# Patient Record
Sex: Female | Born: 1955 | Race: Black or African American | Hispanic: No | Marital: Single
Health system: Southern US, Community
[De-identification: ages and names within clinical notes are randomized; demographics above are authoritative.]

---

## 2011-03-13 ENCOUNTER — Emergency Department: Payer: Self-pay | Admitting: Emergency Medicine

## 2011-10-03 ENCOUNTER — Ambulatory Visit: Payer: Self-pay | Admitting: Bariatrics

## 2011-10-09 ENCOUNTER — Ambulatory Visit: Payer: Self-pay | Admitting: Bariatrics

## 2011-10-09 LAB — COMPREHENSIVE METABOLIC PANEL
Albumin: 3.6 g/dL (ref 3.4–5.0)
Alkaline Phosphatase: 87 U/L (ref 50–136)
Anion Gap: 10 (ref 7–16)
BUN: 13 mg/dL (ref 7–18)
Bilirubin,Total: 0.4 mg/dL (ref 0.2–1.0)
Co2: 28 mmol/L (ref 21–32)
Creatinine: 0.89 mg/dL (ref 0.60–1.30)
Osmolality: 282 (ref 275–301)
Potassium: 3.9 mmol/L (ref 3.5–5.1)
SGPT (ALT): 33 U/L
Sodium: 141 mmol/L (ref 136–145)

## 2011-10-09 LAB — CBC WITH DIFFERENTIAL/PLATELET
HCT: 36.4 % (ref 35.0–47.0)
Lymphocyte #: 1.3 10*3/uL (ref 1.0–3.6)
MCH: 29.9 pg (ref 26.0–34.0)
MCV: 91 fL (ref 80–100)
Monocyte #: 0.3 10*3/uL (ref 0.0–0.7)
Monocyte %: 9.5 %
Neutrophil %: 49.6 %
Platelet: 192 10*3/uL (ref 150–440)
RDW: 12.7 % (ref 11.5–14.5)

## 2011-10-09 LAB — TSH: Thyroid Stimulating Horm: 1.96 u[IU]/mL

## 2011-10-09 LAB — PHOSPHORUS: Phosphorus: 3 mg/dL (ref 2.5–4.9)

## 2011-10-09 LAB — MAGNESIUM: Magnesium: 1.6 mg/dL — ABNORMAL LOW

## 2011-10-09 LAB — LIPASE, BLOOD: Lipase: 78 U/L (ref 73–393)

## 2011-10-09 LAB — PROTIME-INR: INR: 1

## 2011-10-23 ENCOUNTER — Ambulatory Visit: Payer: Self-pay | Admitting: Bariatrics

## 2011-10-30 ENCOUNTER — Ambulatory Visit: Payer: Self-pay | Admitting: Bariatrics

## 2011-11-08 ENCOUNTER — Inpatient Hospital Stay: Payer: Self-pay | Admitting: Specialist

## 2011-11-08 LAB — BASIC METABOLIC PANEL
Anion Gap: 9 (ref 7–16)
BUN: 44 mg/dL — ABNORMAL HIGH (ref 7–18)
Calcium, Total: 8.9 mg/dL (ref 8.5–10.1)
Chloride: 99 mmol/L (ref 98–107)
Co2: 29 mmol/L (ref 21–32)
Creatinine: 2.03 mg/dL — ABNORMAL HIGH (ref 0.60–1.30)
EGFR (African American): 31 — ABNORMAL LOW
EGFR (Non-African Amer.): 27 — ABNORMAL LOW
Glucose: 135 mg/dL — ABNORMAL HIGH (ref 65–99)
Sodium: 137 mmol/L (ref 136–145)

## 2011-11-08 LAB — CBC
MCV: 91 fL (ref 80–100)
Platelet: 221 10*3/uL (ref 150–440)
RDW: 13.8 % (ref 11.5–14.5)

## 2011-11-09 LAB — BASIC METABOLIC PANEL
Co2: 28 mmol/L (ref 21–32)
Creatinine: 1.12 mg/dL (ref 0.60–1.30)
EGFR (African American): >60
Osmolality: 291 (ref 275–301)
Sodium: 137 mmol/L (ref 136–145)

## 2011-11-20 ENCOUNTER — Ambulatory Visit: Payer: Self-pay | Admitting: Bariatrics

## 2011-11-26 ENCOUNTER — Ambulatory Visit: Payer: Self-pay | Admitting: Bariatrics

## 2012-01-06 ENCOUNTER — Ambulatory Visit: Payer: Self-pay | Admitting: Bariatrics

## 2012-01-13 ENCOUNTER — Inpatient Hospital Stay: Payer: Self-pay | Admitting: Bariatrics

## 2012-01-14 LAB — CBC WITH DIFFERENTIAL/PLATELET
Basophil #: 0 10*3/uL (ref 0.0–0.1)
Eosinophil %: 0.4 %
HGB: 12.4 g/dL (ref 12.0–16.0)
Lymphocyte %: 14.5 %
MCH: 30.3 pg (ref 26.0–34.0)
MCHC: 33.6 g/dL (ref 32.0–36.0)
MCV: 90 fL (ref 80–100)
Neutrophil #: 5 10*3/uL (ref 1.4–6.5)
Neutrophil %: 78.2 %
RBC: 4.1 10*6/uL (ref 3.80–5.20)
RDW: 13.6 % (ref 11.5–14.5)
WBC: 6.4 10*3/uL (ref 3.6–11.0)

## 2012-01-14 LAB — ALBUMIN: Albumin: 3.1 g/dL — ABNORMAL LOW (ref 3.4–5.0)

## 2012-01-14 LAB — BASIC METABOLIC PANEL
Anion Gap: 8 (ref 7–16)
BUN: 11 mg/dL (ref 7–18)
Calcium, Total: 8.1 mg/dL — ABNORMAL LOW (ref 8.5–10.1)
Chloride: 102 mmol/L (ref 98–107)
Co2: 28 mmol/L (ref 21–32)
EGFR (Non-African Amer.): >60
Osmolality: 279 (ref 275–301)
Potassium: 4.4 mmol/L (ref 3.5–5.1)
Sodium: 138 mmol/L (ref 136–145)

## 2012-01-19 LAB — PATHOLOGY REPORT

## 2013-04-28 IMAGING — RF DG UGI W/O KUB
4 series · 8 of 8 positions shown · non-contrast
Comparison: none

REASON FOR EXAM: morbid obesity asthma
COMMENTS:

PROCEDURE:     FL  - FL UPPER GI  - October 09, 2011 [DATE]
RESULT:     Comparison: None.
TECHNIQUE: Standard double contrast upper GI was performed, with monitoring
by intermittent fluoroscopy.

[Series 1: fluoro_barium 2fps_bw · 0.17mm/px · 2 of 2 frames shown (1 of 4)]
[frame 1/2]
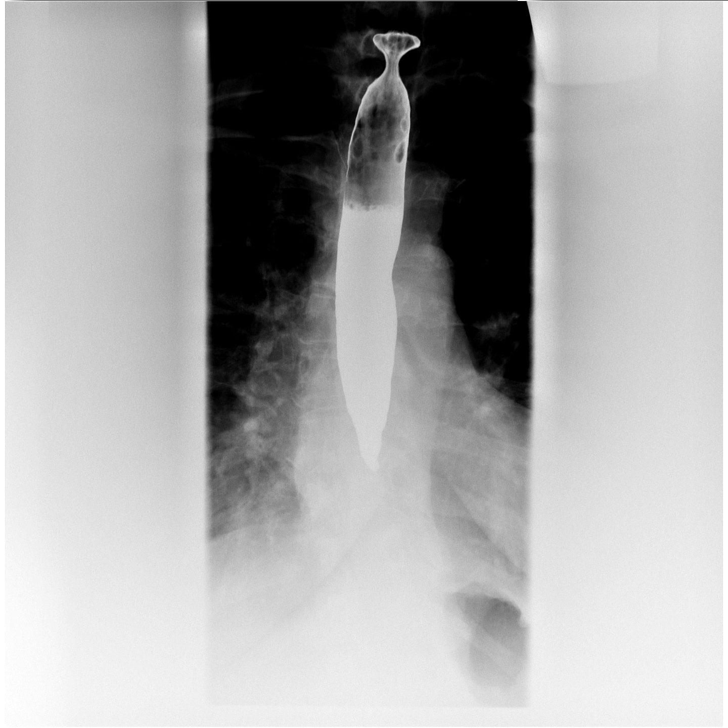
[frame 2/2]
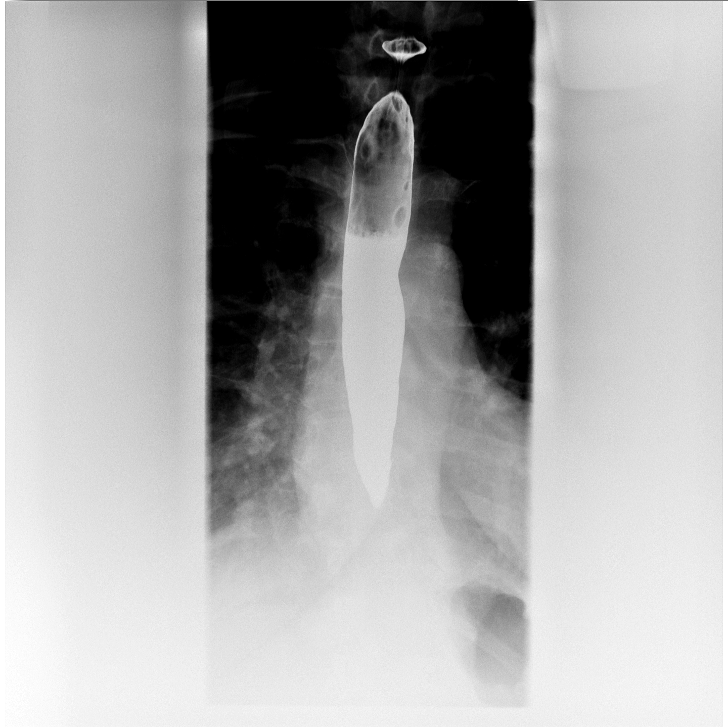

[Series 2: fluoro_barium 2fps_bw · 0.17mm/px · 2 of 2 frames shown (2 of 4)]
[frame 1/2]
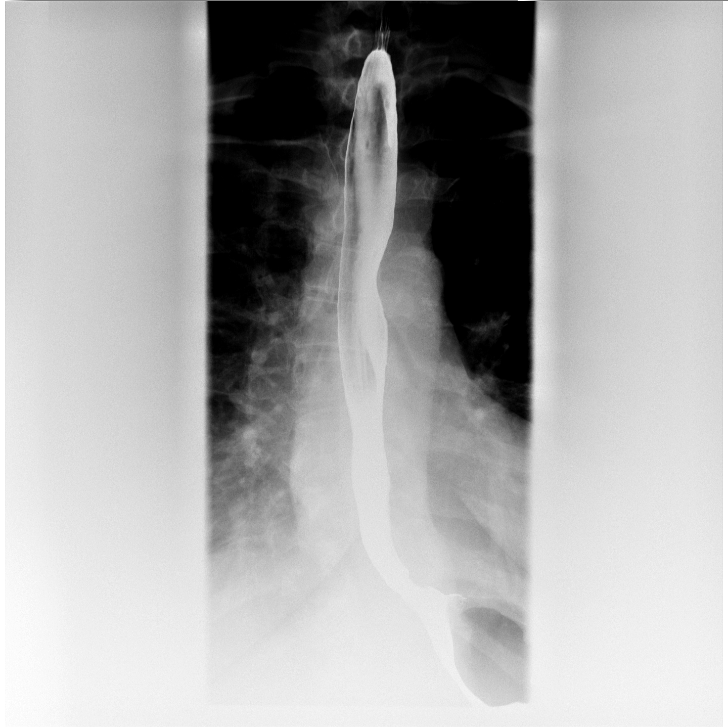
[frame 2/2]
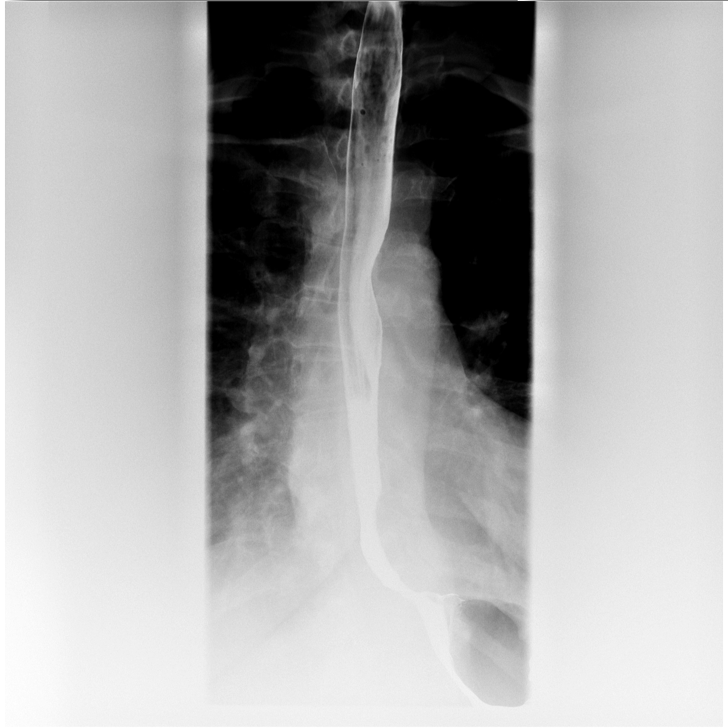

[Series 3: fluoro_barium 2fps_bw · 0.17mm/px · 2 of 2 frames shown (3 of 4)]
[frame 1/2]
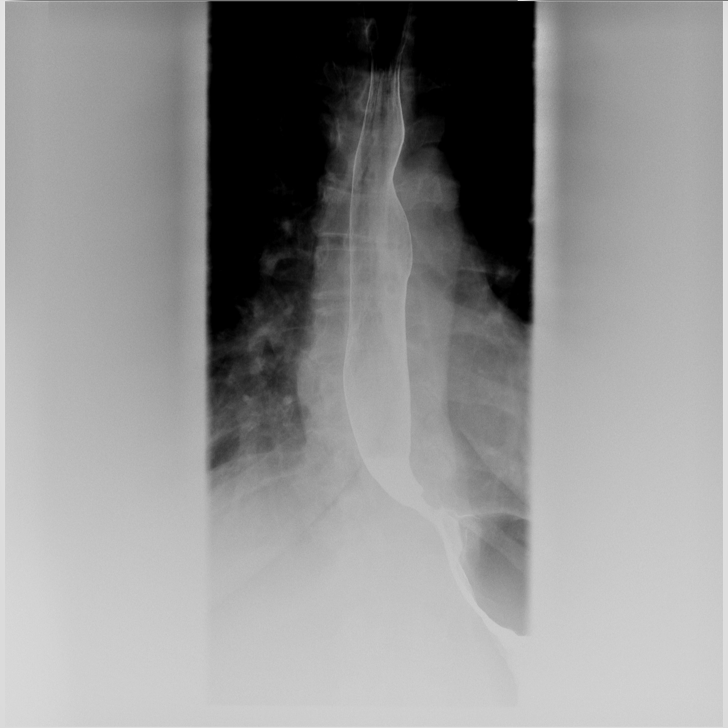
[frame 2/2]
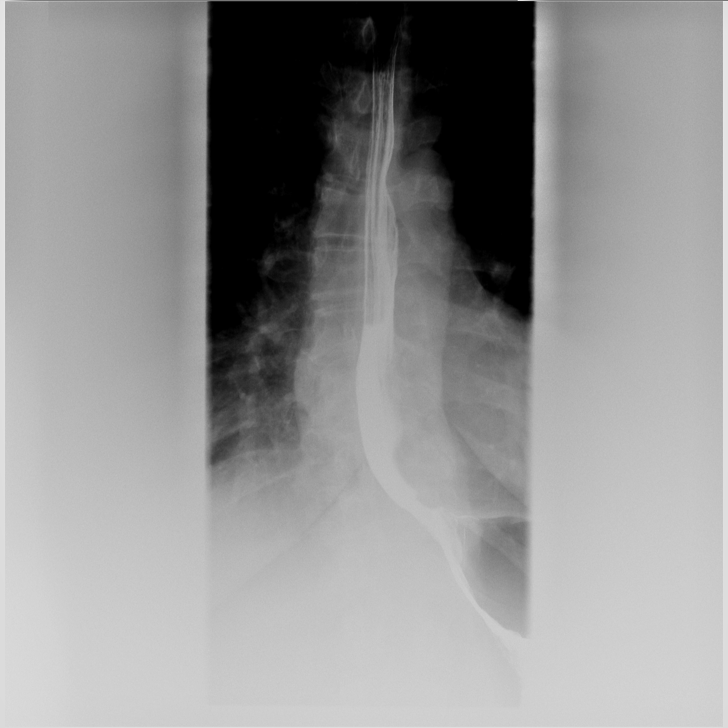

[Series 4: fluoro_barium 2fps_bw · 0.17mm/px · 2 of 2 frames shown (4 of 4)]
[frame 1/2]
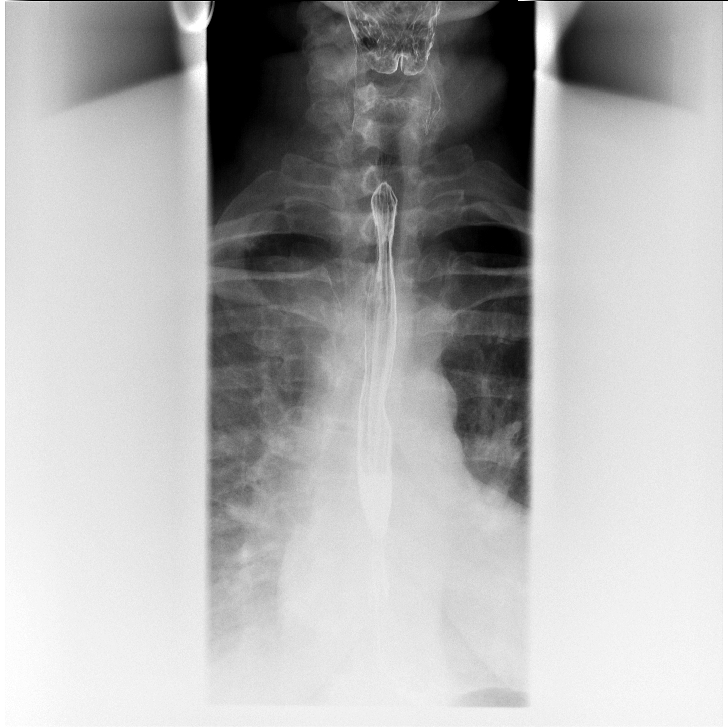
[frame 2/2]
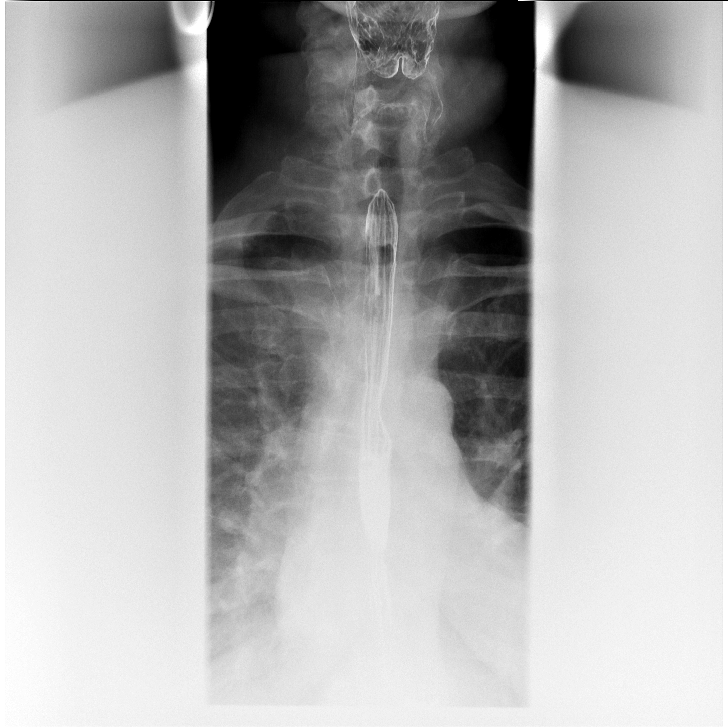

[8 of 8 positions shown; findings below may reference images not displayed]

FINDINGS: The thoracic esophagus is normal in caliber. No intraluminal filling defects
or ulcerations identified. There is normal peristalsis of the thoracic
esophagus. There is a small to moderate size hiatal hernia present. There
may be minimal narrowing of the gastroesophageal junction. No
gastroesophageal reflux seen during examination.

The stomach was normal in caliber. No intraluminal filling defects or
ulcerations identified. The cylindrical radiopaque density overlying the
abdomen on several images is secondary to the patient's clothing.
IMPRESSION: Small to moderate size hiatal hernia. There may be minimal narrowing of the
gastroesophageal junction, which is nonspecific.

## 2013-04-28 IMAGING — CR DG CHEST 2V
1 series · 2 of 2 positions shown · non-contrast
Comparison: none

REASON FOR EXAM: asthma,morbid obesity,
COMMENTS:

PROCEDURE:     DXR - DXR CHEST PA (OR AP) AND LATERAL  - October 09, 2011  [DATE]
RESULT:     The lung fields are clear. No pneumonia, pneumothorax or pleural
effusion is seen. The heart size is normal. The mediastinal and osseous
structures reveal no significant abnormalities.

[Series 1: w chest pa · 0.14mm/px · 2 of 2 slices shown]
[im 1/2]
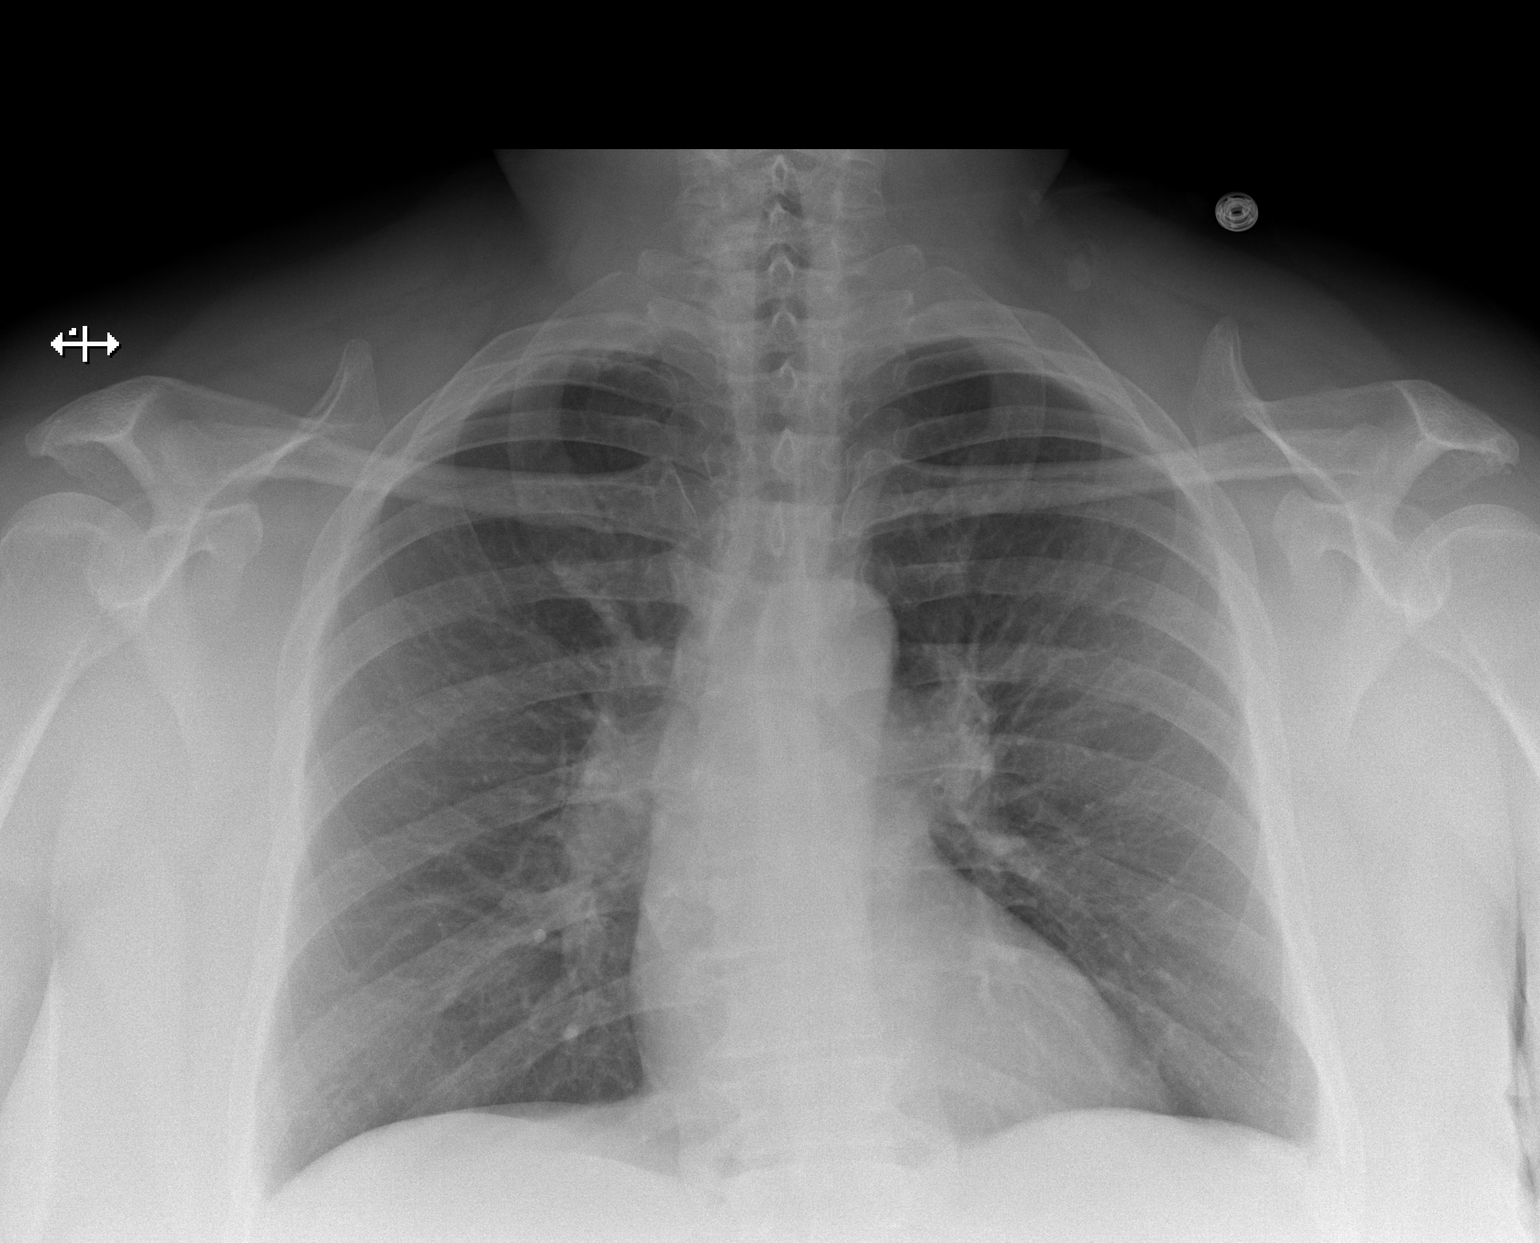
[im 2/2]
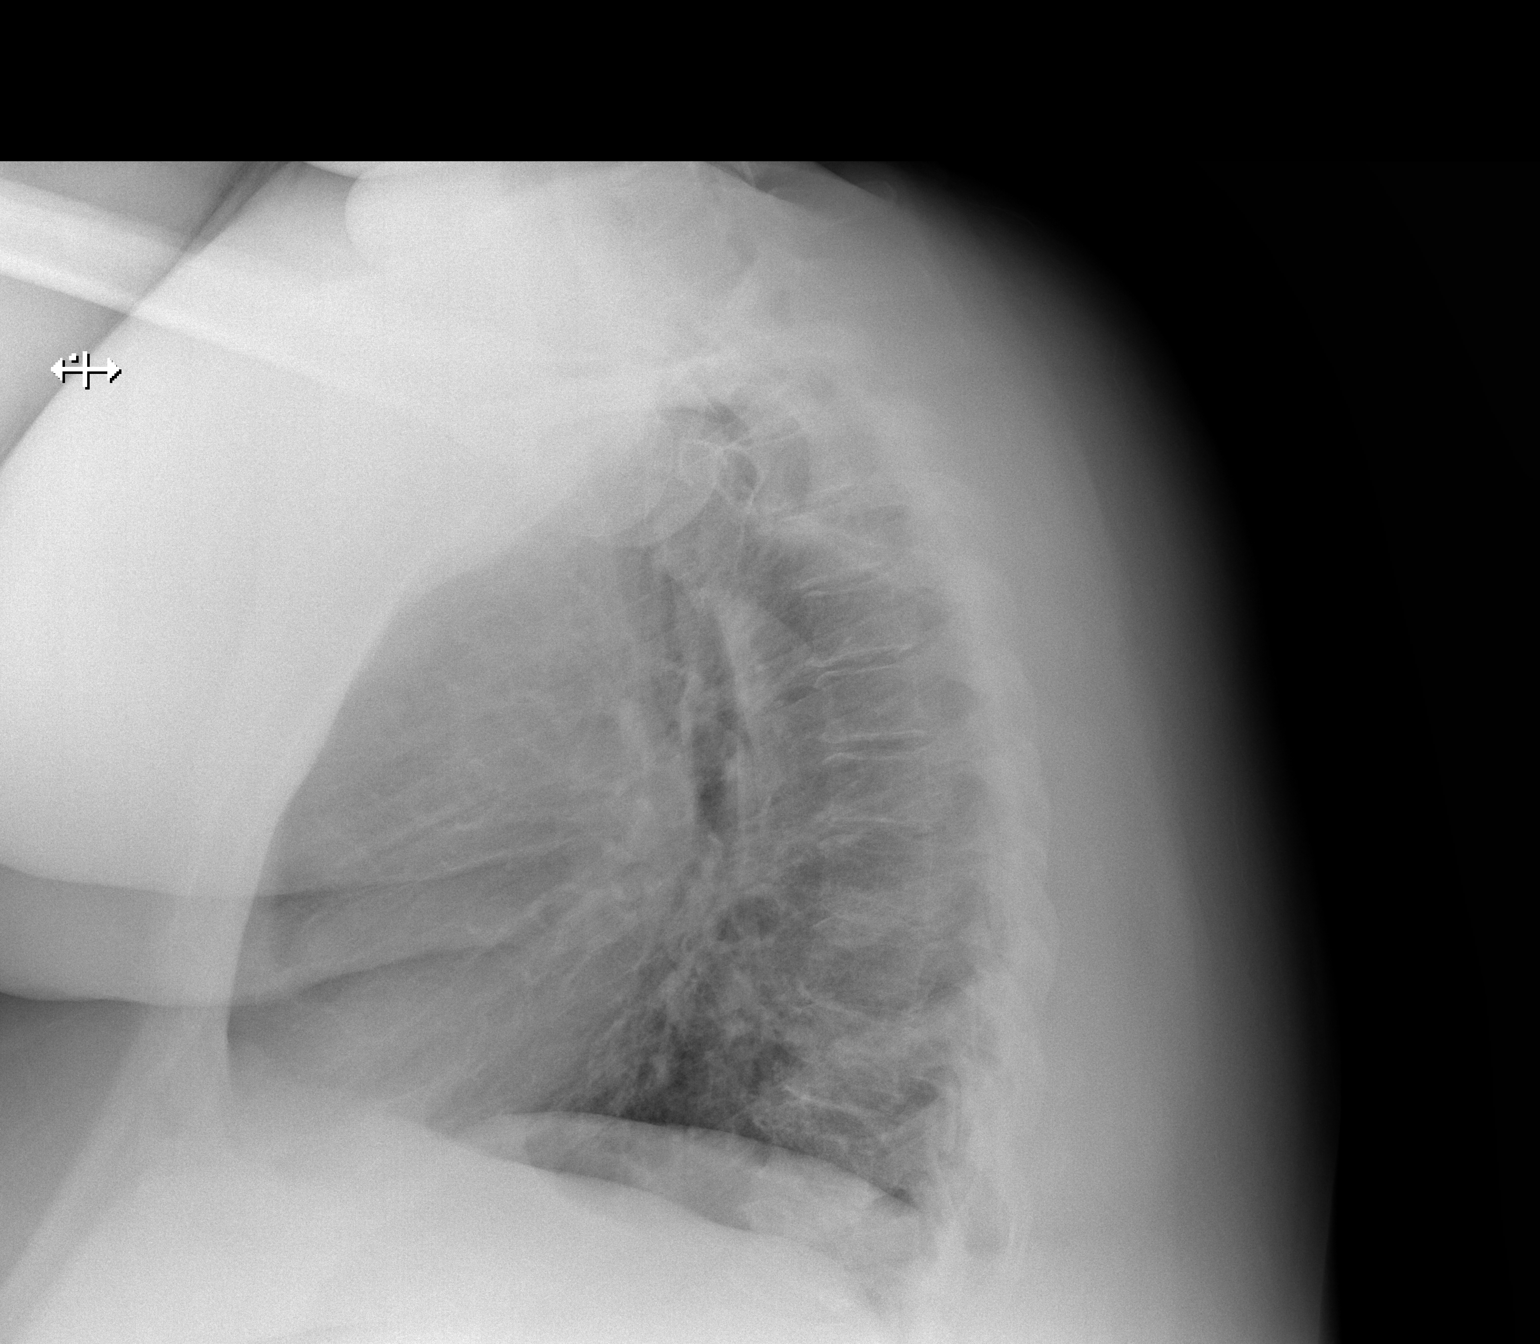

[2 of 2 positions shown; findings below may reference images not displayed]

IMPRESSION: No acute changes are identified.

## 2014-11-19 NOTE — Op Note (Signed)
PATIENT NAME:  Misty Melendez, Misty Melendez MR#:  [id number] DATE OF BIRTH:  24-Jan-1956  DATE OF PROCEDURE:  01/13/2012  PREOPERATIVE DIAGNOSES:  1. Morbid obesity associated with obstructive sleep apnea. 2. Secondary issue presence of hiatal hernia as noted by upper GI series and also large anterior abdominal wall hernia.   POSTOPERATIVE DIAGNOSES:  1. Morbid obesity associated with obstructive sleep apnea. 2. Secondary issue presence of hiatal hernia as noted by upper GI series and also large anterior abdominal wall hernia.   PROCEDURES PERFORMED:  1. Laparoscopic lysis of adhesions. 2. Gastric bypass with Roux-en-Y gastrojejunostomy. 3. Intraoperative endoscopy.   SURGEON: Tyrone Apple. Alva Garnet, MD  ASSISTANT: Sanjuana Kava, PA.   DESCRIPTION OF PROCEDURE: The patient was brought to the Operating Room, placed in a supine position. General anesthesia obtained with orotracheal intubation. The abdomen was sterilely prepped and draped after Foley catheter inserted sterilely. TED hose and Thromboguards were and a foot board placed at the end of the operative bed. The patient had introduction of a 5 mm Optiview trocar introduced in the left upper quadrant of the abdomen under direct visualization. Pneumoperitoneum obtained with carbon dioxide. The patient noted to have a large central abdominal incarcerated hernia with broad area of omental adhesions associated with this. There was a broad sheet of omental adhesions also associated with the upper midline area. An area free of adhesions identified in a small window just inferior to the falciform ligament and direct visualization was used to gain access for a 5 mm Optiview trocar introduced in the right upper quadrant. Additional trocar introduced in the right lower quadrant. The omental adhesions to the falciform ligament were taken down by use of the Harmonic scalpel. The falciform ligament also divided from its attachments to the anterior abdominal wall. This left  intact broad area of omental adhesion associated with the upper abdominal incisional hernia. A series of three additional trocars introduced across the upper abdomen. The patient had division of the transverse colon omentum with a line of division being extended just to the left of the herniated omental contents in the anterior abdominal wall hernia. The intent being to leave this herniated contents alone, anticipating a repair of this following significant weight loss associated with the anticipated gastric bypass. The ligament of Treitz was identified. The bowel was followed distally 50 cm at which point it was divided with a white load Ethicon GIA stapler. The arcade vessels divided by use of the Harmonic scalpel. The small bowel was followed distally an additional 125 cm at which point a side-to-side jejunal/jejunal anastomosis was configured. This was accomplished with enterotomies on the antimesenteric border of both the biliary limb and on the common channel portion of jejunum. Enterotomies being created on the antimesenteric border of these portions of the bowel. A white load stapler used to create a common channel. This followed by closure of the resulting enterotomy with a repeat firing of white load GI stapler. Anti-torsion sutures placed distal to anastomosis and the mesenteric window closed with a running 2-0 Ethibond suture. The mobilized Roux limb was then delivered anterior to the colon and secured to the anterior gastric wall. A Nathanson liver retractor was introduced through a subxiphoid wound with elevation of the left lobe of the liver. The patient noted to have a large fat pad in the region of the GE junction. This was freed from the undersurface of the left hemidiaphragm and from the stomach associated with the angle of His. This fatty tissue being removed through the right  upper abdominal trocar wound. The patient noted to have a small hiatal hernia as noted by preoperative upper GI series.  The gastrohepatic ligament incised by use of the Harmonic scalpel and the herniated peritoneum associated with the anterior hiatus incised by use of the Harmonic scalpel. Blunt dissection was used to create a preperitoneal plane separating the herniated stomach peritoneum and anterior esophagus away from the overlying pericardium. The peritoneum was incised along the lateral aspect of the right crus and further blunt dissection was used to initiate reduction of herniated lesser sac fatty tissue. The posterior esophagus and vagus nerve elevated from the underlying aorta by blunt dissection and light use of the Harmonic scalpel. Phrenoesophageal ligaments also incised by use of the Harmonic scalpel. Further blunt dissection was performed circumferential to the distal esophagus freeing it from the pleural surfaces and the aorta posteriorly. Eventually able to deliver approximately 2 cm of esophagus lying comfortably in the abdominal cavity. Two interrupted 0 Ethibond sutures used to approximate the crural musculature posteriorly. An area was then chosen 5 cm inferior to the GE junction for division of the lesser curvature fatty tissue. This was accomplished by use of the Harmonic scalpel. There was bleeding from one arterial branch which was controlled by oversewing with 2-0 Ethibond suture. Next, with a nasogastric tube withdrawn a proximal gastric pouch was created. This was accomplished with gold load staples supplemented by seam guard staple reinforcement system. The transverse firing was then followed by a vertical line of staples brought out just lateral to the angle of His creating a relatively small proximal gastric pouch. At this point, a distal posterior gastrojejunal anastomosis configured. This was accomplished with enterotomies on the distal posterior aspect of the gastric pouch and the antimesenteric border of the Roux limb. A blue load stapler was fired at 2 cm mark. Next, the resulting enterotomy closed  with a running 2-0 Vicryl suture. This was accomplished while a 34 French bougie was in place to be used as an aid in sizing of the closure. The suture and staple lines then reinforced with additional running 2-0 Vicryl suture. Intraoperative endoscopy performed at this point. With a saline bath there was no evidence of an air leak appreciated. The patient had mobilization of the left lateral mental limb which was then directed over the area of the jejunal anastomosis and secured to the anterior stomach with interrupted Ethibond suture. Peterson defect closed with running and interrupted 2-0 Ethibond suture. The pneumoperitoneum was relieved at this point after confirming hemostasis in the region of the jejunal anastomosis. At this point the patient allowed to recover having tolerated the procedure well. There was minimal blood loss.   ____________________________ Tyrone AppleMichael A. Alva Garnetyner, MD mat:cms D: 01/14/2012 22:04:00 ET T: 01/15/2012 09:42:25 ET JOB#: [id number]  cc: Casimiro NeedleMichael A. Alva Garnetyner, MD, <Dictator> Everette RankMICHAEL A Khiry Pasquariello MD ELECTRONICALLY SIGNED 01/20/2012 20:34

## 2014-11-19 NOTE — Discharge Summary (Signed)
PATIENT NAME:  Misty Melendez, Misty Melendez MR#:  [id number] DATE OF BIRTH:  August 05, 1955  DATE OF ADMISSION:  11/08/2011 DATE OF DISCHARGE:  11/09/2011  DISPOSITION: The patient left AGAINST MEDICAL ADVICE .  BRIEF HOSPITAL COURSE: This is a 59 year old female with past medical history of asthma, obesity, and obstructive sleep apnea who presented to the hospital due to asthma exacerbation and acute renal failure. She was admitted to the hospital and started on treatment for asthma exacerbation with IV steroids, empiric antibiotics, around-the-clock nebulizer treatments, and also placed on some IV fluids for her renal failure. The morning after admission, the patient apparently left AGAINST MEDICAL ADVICE as she claims she had a disabled son that nobody could take care of and she had to leave. Prior to leaving a metabolic profile was checked and creatinine had improved and based on her vital signs she clinically looks stable. The patient is normally followed up by Dr. Juel BurrowMasoud.   TIME SPENT: 30 minutes.    ____________________________ Rolly PancakeVivek J. Cherlynn KaiserSainani, MD vjs:rbg D: 11/09/2011 15:51:10 ET T: 11/10/2011 15:45:20 ET JOB#: [id number]  cc: Rolly PancakeVivek J. Cherlynn KaiserSainani, MD, <Dictator> Corky DownsJaved Masoud, MD [place] SirenVIVEK J Karson Chicas MD ELECTRONICALLY SIGNED 11/12/2011 16:17

## 2014-11-19 NOTE — H&P (Signed)
PATIENT NAME:  Misty Melendez, Lakelyn MR#:  [id number] DATE OF BIRTH:  [birth year]  DATE OF ADMISSION:  11/08/2011  PRIMARY CARE PHYSICIAN: Corky DownsJaved Masoud, MD   CHIEF COMPLAINT: Shortness of breath, cough and chills.   HISTORY OF PRESENT ILLNESS: The patient is a [age] year old female who presents to the Emergency Room due to shortness of breath, cough, fevers and chills.   The patient says that she started feeling ill this past Wednesday. It started off initially with some postnasal drip and cough. She became more short of breath and started hearing some audible wheezing. She went to see her primary care physician on Thursday and was started on a prednisone taper and some oral Levaquin. Despite being on that for a couple of days, her symptoms are not improved, and she is having worsening shortness of breath. Overnight, she had significant chills and was concerned that she may have a pneumonia and, therefore, came to the ER for further evaluation. She admits to a cough which is productive with greenish-yellow sputum but no nausea, no vomiting, no chest pain, no abdominal pain. She does admit to some diarrhea which she had a few days ago which has now resolved. When the patient presented to the ER, she was significantly bronchospastic and received continuous nebulizer treatments with some mild improvement in her wheezing. Hospitalist Service was then contacted for further treatment and evaluation.   REVIEW OF SYSTEMS: CONSTITUTIONAL: No documented fever. Positive chills. No weight gain, no weight loss. EYES: No blurry or double vision. ENT: Positive postnasal drip. No tinnitus. No redness of the oropharynx. RESPIRATORY: Positive cough. Positive wheeze. No hemoptysis. Positive dyspnea on exertion. CARDIOVASCULAR: No chest pain, no orthopnea, no palpitations, no syncope. GI: No nausea, no vomiting, no diarrhea, no abdominal pain, no melena or hematochezia. GU: No dysuria or hematuria. ENDOCRINE: No polyuria or nocturia.  No heat or cold intolerance. HEMATOLOGIC: No anemia, no bruising, no bleeding. INTEGUMENTARY: No rashes. No lesions. MUSCULOSKELETAL: No arthritis, no swelling, no gout. NEUROLOGICAL: No numbness or tingling, no ataxia, no seizure-type activity. PSYCHIATRIC: No anxiety, no insomnia, no ADD.   PAST MEDICAL HISTORY:  1. Asthma. 2. Obesity. 3. Obstructive sleep apnea.   ALLERGIES: No known drug allergies.   SOCIAL HISTORY: No smoking. No alcohol abuse. No illicit drug abuse. She lives at home by herself.   FAMILY HISTORY: Her father died from cancer of unknown type. Mother died from heart disease.   CURRENT MEDICATIONS:  1. Levaquin 500 mg daily, started 2 days ago x5 days. 2. Prednisone taper.  3. Albuterol inhaler as needed.   PHYSICAL EXAMINATION:  VITAL SIGNS: On admission, temperature is 98.2, pulse 85, respirations 23, blood pressure 131/79, saturations 96% on 2 liters nasal cannula.   GENERAL: She is a pleasant-appearing female in mild respiratory distress.   HEENT: Atraumatic, normocephalic. Her extraocular muscles are intact. Pupils are equal and reactive to light. Sclerae are anicteric. No conjunctival injection. No pharyngeal erythema.   NECK: Supple. There is no jugular venous distention. No bruits, lymphadenopathy or thyromegaly.   HEART: Regular rate and rhythm. No murmurs, no rubs, no clicks.   LUNGS: She has poor expiratory effort. Positive end expiratory wheezing. Negative use of accessory muscles. No dullness to percussion.   ABDOMEN: Soft, flat, nontender, nondistended. Has good bowel sounds. No hepatosplenomegaly appreciated.   EXTREMITIES: No evidence of any cyanosis, clubbing, or peripheral edema. Has +2 pedal and radial pulses bilaterally.   NEUROLOGICAL: The patient is alert, awake, and oriented x3 with no focal motor  or sensory deficits appreciated bilaterally.   SKIN: Moist and warm with no rash appreciated.   LYMPHATIC: There is no cervical or axillary  lymphadenopathy.   LABORATORY, DIAGNOSTIC AND RADIOLOGICAL DATA:  Serum glucose 135, BUN 44, creatinine 2.03, sodium 137, potassium 3.6, chloride 99, bicarbonate 29.  The patient's white cell count was 7.8, hemoglobin 12.4, hematocrit 37.7, platelet count 221.  The patient did have a chest x-ray done which showed poor inspiratory effort with hypoinflated lung fields.   ASSESSMENT AND PLAN: The patient is a 59 year old female with past medical history of obstructive sleep apnea, obesity, asthma, came into the hospital with shortness of breath, cough, and chills, and was noted to be in asthma exacerbation with acute renal failure.   1. Asthma exacerbation: This is likely secondary to a bronchitis. The patient has failed outpatient therapy with prednisone taper and Levaquin. I will admit her with IV steroids, continue around-the-clock albuterol SVNs. Continue her p.o. Levaquin. Give her some Flonase for her postnasal drip. Follow her sputum cultures and follow her clinically.  2. Acute renal failure: This is likely secondary to dehydration and prerenal azotemia. I will go ahead and hydrate her with IV fluids. Follow her BUN and creatinine and urine output. Renal dose her medications, avoid nephrotoxins.   CODE STATUS: THE PATIENT IS A FULL CODE.   TIME SPENT WITH ADMISSION:  45 minutes.  ____________________________ Rolly Pancake. Cherlynn Kaiser, MD vjs:cbb D: 11/08/2011 09:48:59 ET T: 11/08/2011 10:57:53 ET JOB#: [id number]  cc: Rolly Pancake. Cherlynn Kaiser, MD, <Dictator> Corky Downs, MD [place] Siren MD ELECTRONICALLY SIGNED 11/09/2011 8:07
